# Patient Record
Sex: Male | Born: 1985 | Race: Black or African American | Hispanic: No | Marital: Single | Smoking: Current every day smoker
Health system: Southern US, Community
[De-identification: ages and names within clinical notes are randomized; demographics above are authoritative.]

---
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
## 2007-09-28 ENCOUNTER — Emergency Department (HOSPITAL_COMMUNITY): Admission: EM | Admit: 2007-09-28 | Discharge: 2007-09-28 | Payer: Self-pay | Admitting: Emergency Medicine

## 2009-09-07 IMAGING — US US SCROTUM
1 series · 13 of 25 positions shown · non-contrast
Comparison: None available

CLINICAL DATA: Pelvic pain, back pain the

SCROTAL ULTRASOUND
DOPPLER ULTRASOUND OF THE TESTICLES
TECHNIQUE: Complete ultrasound examination of the testicles,
epididymis, and other scrotal structures was performed.  Color and
spectral Doppler ultrasound were also utilized to evaluate blood
flow to the testicles.

[Series 1: unknown · 0.11mm/px · 13 of 65 slices shown]
[im 1/65]
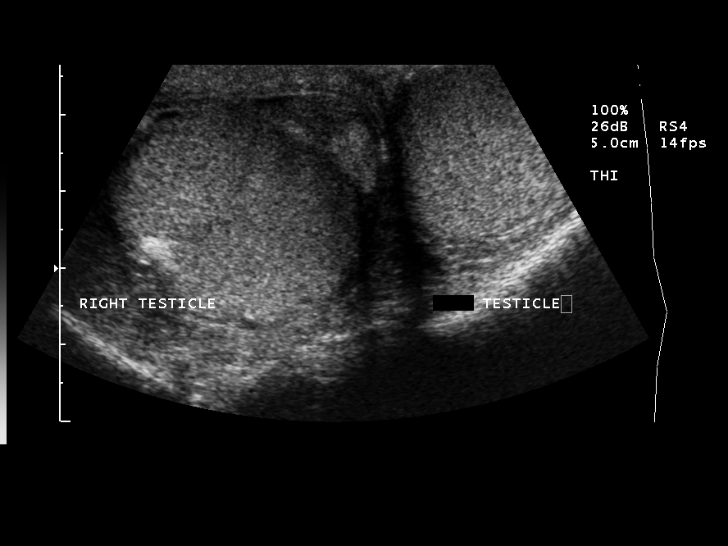
[im 6/65]
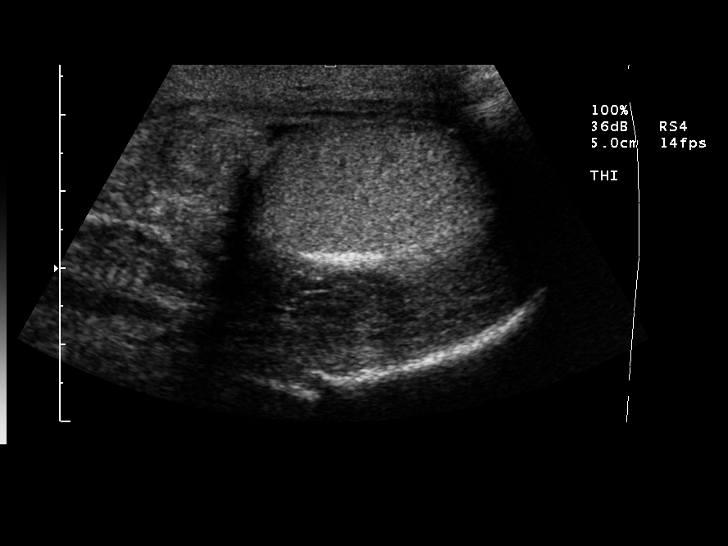
[im 11/65]
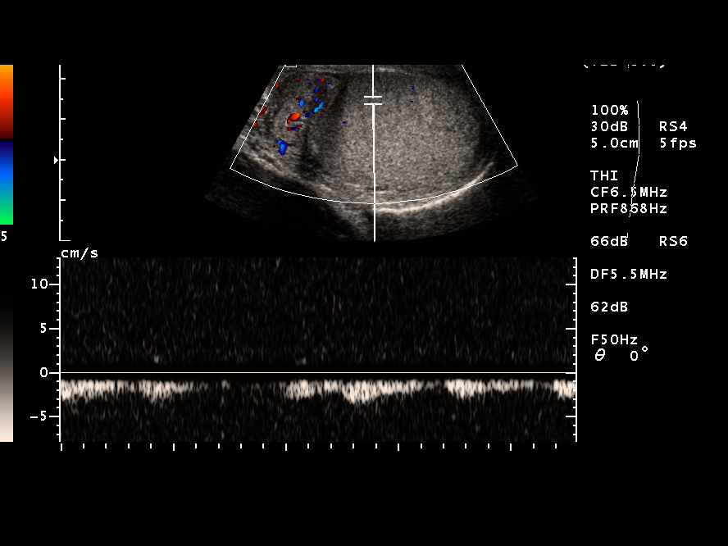
[im 17/65]
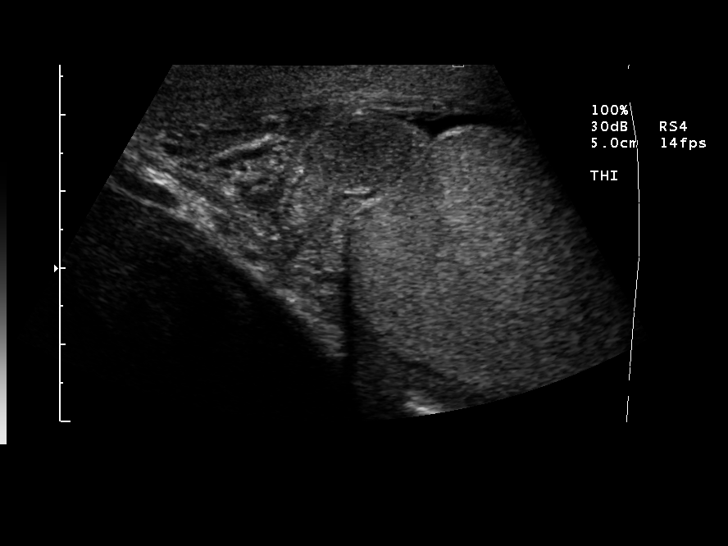
[im 22/65]
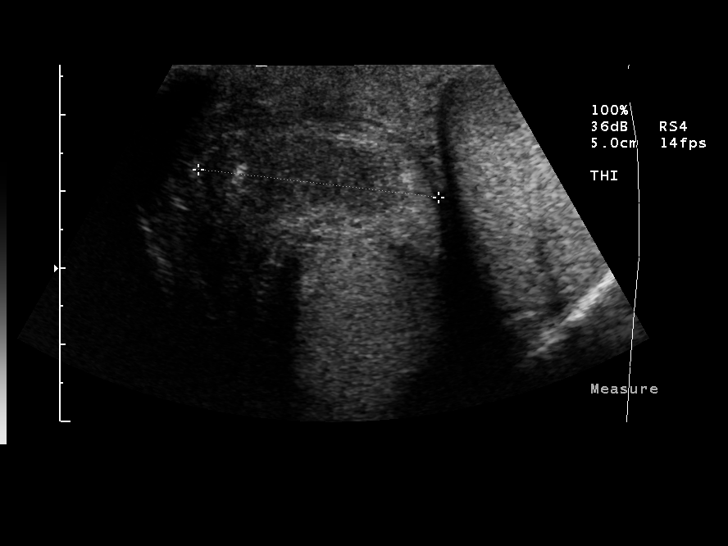
[im 27/65]
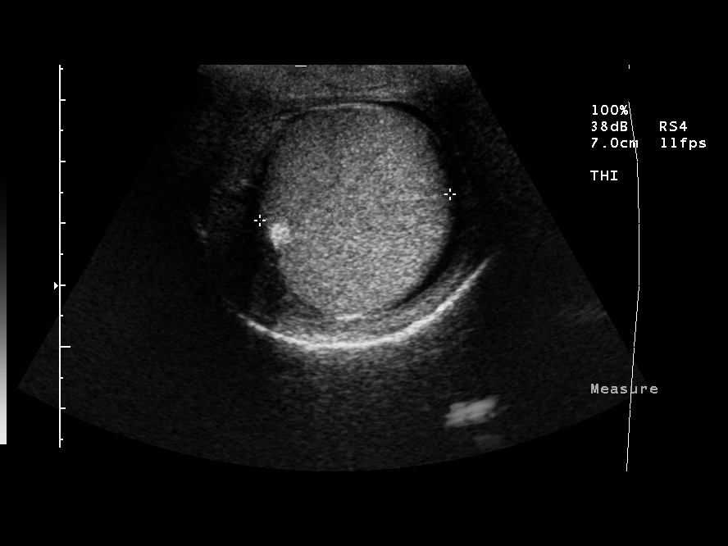
[im 33/65]
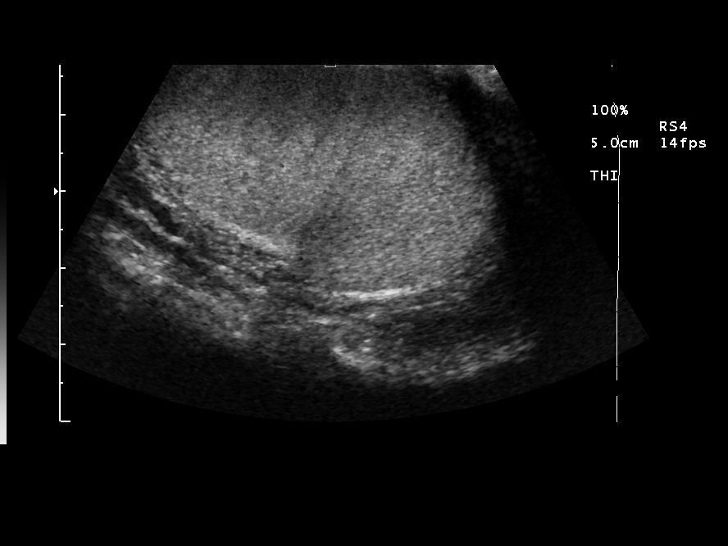
[im 38/65]
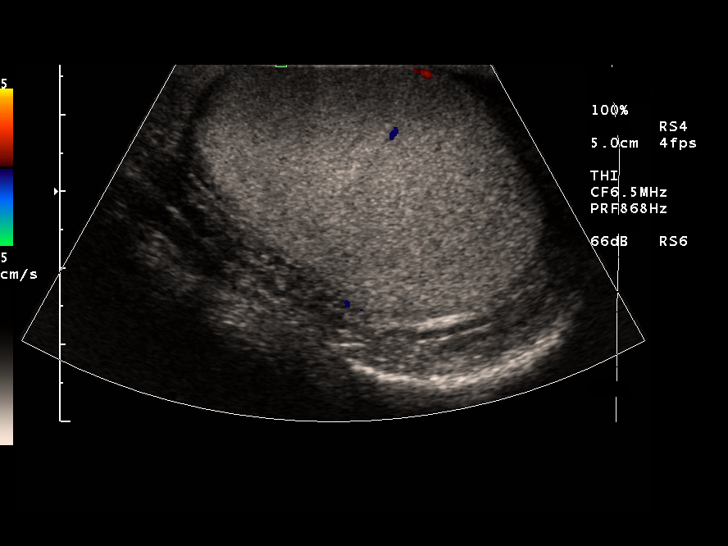
[im 43/65]
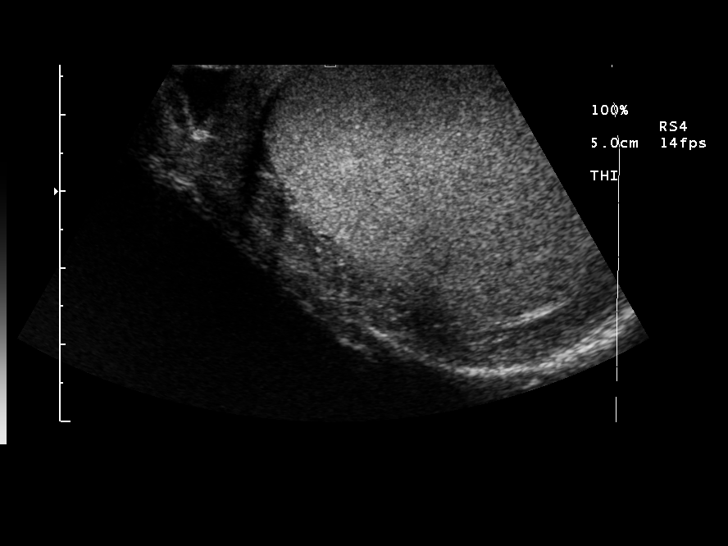
[im 49/65]
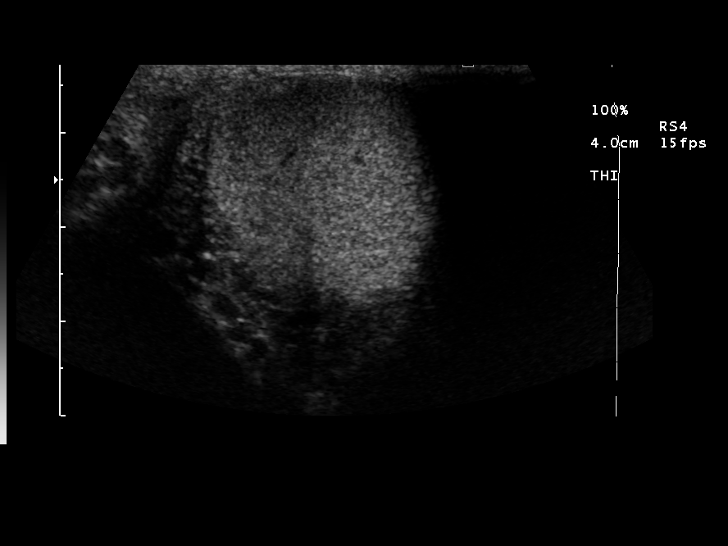
[im 54/65]
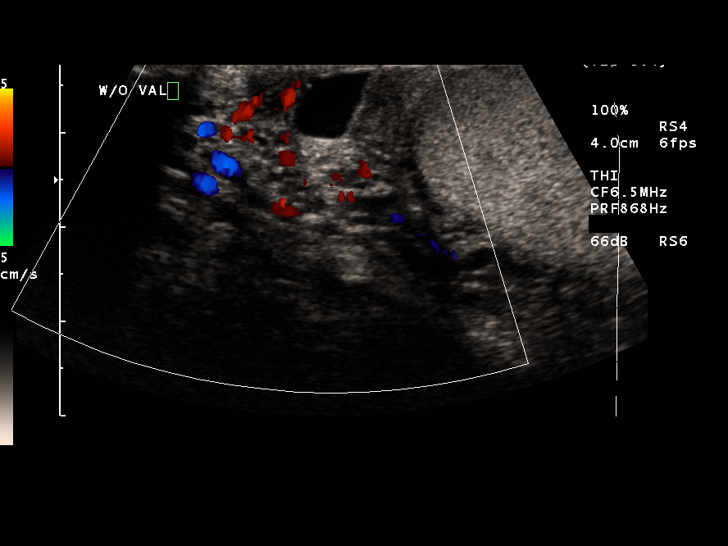
[im 59/65]
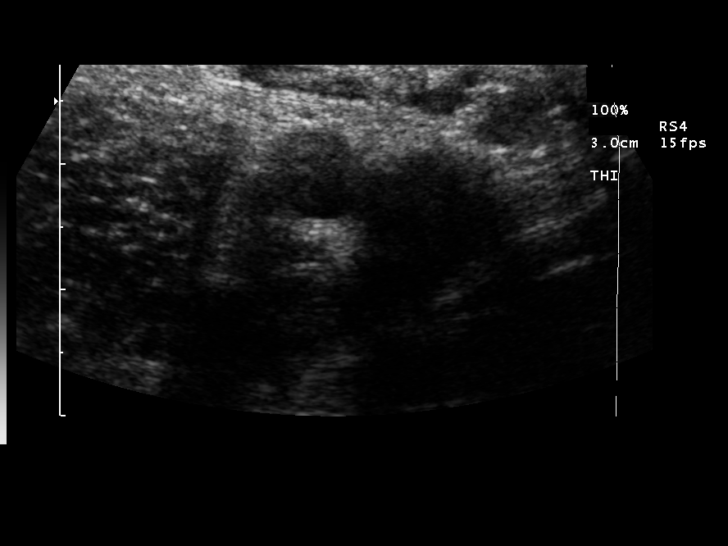
[im 65/65]
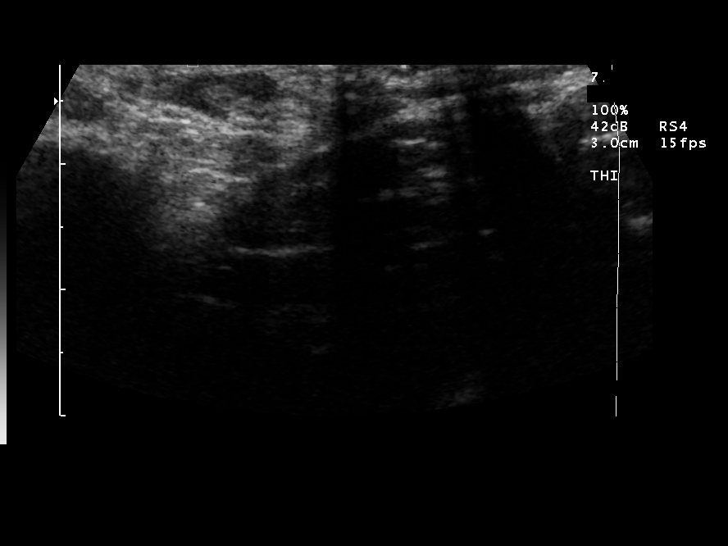

[13 of 25 positions shown; findings below may reference images not displayed]

FINDINGS: Right testicle:  The right testicle is homogeneous in echo texture
measuring 4.0 x 2.8 x 3.1 cm.  There is normal color Doppler flow.
There is normal arterial and venous wave forms.

Left testicle:  Left testicle is  is homogeneous in echo texture
measuring 4.7 x 3.0 x 3.1 cm.  Normal color Doppler flow.  Normal.
Venous wave forms.

The right epididymis is enlarged and hyperemic measuring 1.1 x
x 2.4 cm.  There is a region of focal hypoechogenicity within the
head of the epididymis which measures approximately at 1.6 cm
cm and is concerning for potential epididymal abscess.

Left epididymis is normal.

There is a small right hydrocele.  No evidence of varicoceles.

There is small right inguinal lymph nodes which have normal
morphology.
IMPRESSION: 1..  Enlarged hyperemic right epididymis consist with epididymitis.
2..  Hypoechoic region within the right epididymis is concerning
for potential early epididymal abscess.  Recommend follow-up
ultrasound imaging in 3-6 weeks to reassess this hypoechogenic
region.

## 2010-09-07 NOTE — Op Note (Signed)
NAME:  Brian Mendez, Brian Mendez                         ACCOUNT NO.:  [id number]   MEDICAL RECORD NO.:  [id number]                   PATIENT TYPE:  INP   LOCATION:  6120                                 FACILITY:  [facility]   PHYSICIAN:  Prabhakar D. Pendse, M.D.           DATE OF BIRTH:  11-Jan-1986   DATE OF PROCEDURE:  09/09/2002  DATE OF DISCHARGE:                                 OPERATIVE REPORT   PREOPERATIVE DIAGNOSIS:  Acute appendicitis.   POSTOPERATIVE DIAGNOSIS:  Acute suppurative appendicitis without  perforation.   OPERATION:  Laparoscopy and appendectomy and control of bleeding from the  left lower quadrant incision.   INDICATIONS FOR PROCEDURE:  This 25 year old boy was seen in the emergency  room with about 20 hours history of progressively worse lower abdominal pain  associated with a few episodes of vomiting. There was no history of trauma,  no URI, no diarrhea. Initial white count was 11,000 with shift to the left.  Urinalysis was normal. CT scan of the abdomen showed free fluid in the  pelvic cavity, however, appendix could not be visualized and no other  possible pathology could be seen. In view of these findings, the patient was  admitted for observation and repeat CBC was carried out in the evening which  showed white count of 11,500 with a shift to the left. The patient continued  to have lower abdominal tenderness and exploratory laparoscopy was planned.   FINDINGS:  At the time of exploration, laparoscopic findings were consistent  with appendix to be about 6 inches long folded upon itself and its distal  two-third markedly congested and rigid consistent with acute suppurative  appendicitis. There was some proteinaceous exudate in the area and some free  fluid in the pelvic cavity. No definite gross ulceration could be  identified. The distal ileum appeared unremarkable.   DESCRIPTION OF PROCEDURE:  Under satisfactory general endotracheal  anesthesia, the patient in  supine position, abdomen thoroughly prepped and  draped in the usual manner. An infraumbilical incision was made, carried  through the fascia and the peritoneal cavity entered, Hasson port was  placed, telescope was passed in. Careful exploration revealed findings as  described above. At this time, a 5 mm port was placed through the right mid  quadrant area in order to grasp the base of the appendix and a 12 mm port  was placed in the left lower quadrant area. The appendix was now grasped and  appendiceal mesentery was occluded with hemoclips and cut. Appendix was then  removed by GIA stapler. The appendix was removed with the endobag. After the  appendix was removed, the appendiceal base was inspected again which showed  that there was still about 1 1/2 inch of an appendix left in situ, hence,  further dissection was carried out to serially separate the mesentery, clip  it and cut it. Now again appendectomy was done with stapler and with some  difficulty the appendix was removed. After removal of the appendix and the  appendiceal base, hemostasis was satisfactory. However, there was some  bleeding noted from the left lower quadrant 12 mm port. This could not be  controlled by the usual standard method, hence, the incision was enlarged  and a hemostat was passed through the opening, the peritoneum was grasped  and pulled out and several through and through sutures of 2-0 Vicryl were  placed in order to control the bleeding, the bleeding seemed to be  controlled. At this time, all the wounds were irrigated and closure was  carried out in layers. The skin was  approximated and stapled, appropriate dressings applied. Throughout the  procedure, the patient's vital signs remained stable. The patient tolerated  the procedure well and returned to the recovery room in satisfactory general  condition.                                               Prabhakar D. Levie Heritage, M.D.    PDP/MEDQ  D:   09/09/2002  T:  09/10/2002  Job:  [id number]   cc:   Ivery Quale, P.A.   Cristi Loron, M.D.  [address], [address]  [place]  [place]  [address]  Fax: [phone]

## 2010-09-07 NOTE — Discharge Summary (Signed)
   NAME:  Brian Mendez, Brian Mendez                         ACCOUNT NO.:  [id number]   MEDICAL RECORD NO.:  [id number]                   PATIENT TYPE:  INP   LOCATION:  6120                                 FACILITY:  [facility]   PHYSICIAN:  Pediatrics Resident                 DATE OF BIRTH:  [birth year]   DATE OF ADMISSION:  09/09/2002  DATE OF DISCHARGE:  09/11/2002                                 DISCHARGE SUMMARY   DISCHARGE DIAGNOSES:  Acute suppurative appendicitis without perforation,  status post appendectomy.   HISTORY OF PRESENT ILLNESS:  The patient is a 25 year old African American  male who is status post laparoscopic appendectomy with presentation of fever  and vomiting.  The patient tolerated the procedure well.  The findings are  consistent with suppurative appendicitis without perforation.   HOSPITAL COURSE:  The patient's hospital course was uneventful.  Postoperatively, the patient received triple antibiotics, ampicillin,  gentamicin, and Clindamycin x 48 hours as inpatient.  The patient was  discharged to home on p.o. Augmentin x 5 days to finish antibiotic course.  To control the pain, the patient initially needed Morphine; however, he has  been weaned from Morphine and is just on p.o. Tylenol for the last 12 hours.   The patient is ambulatory and is currently on a clear liquid diet.  He has  been well hydrated since his IV fluids were stopped.  The patient's wound  dressing site has been clean and dry without erythema.  The patient is to be  discharged to home today and he is eager to attend his mother's wedding.  He  is directed to continue on a clear liquid diet today, a softer diet  tomorrow, and advance as tolerated after tomorrow.   CONDITION ON DISCHARGE:  Improved and is stable.   DISCHARGE MEDICATIONS:  1. Augmentin 500 mg p.o. t.i.d. x 5 more days.  2. Tylenol 650 mg p.o. q.6h. p.r.n. pain.   FOLLOW UP:  The patient will follow up with Dr. Donnella Bi D. Pendse in  10  days and his office number was given to make an appointment.                                               Pediatrics Resident    PR/MEDQ  D:  09/11/2002  T:  09/11/2002  Job:  [id number]

## 2011-01-17 LAB — URINALYSIS, ROUTINE W REFLEX MICROSCOPIC
Bilirubin Urine: NEGATIVE
Glucose, UA: NEGATIVE
Nitrite: NEGATIVE
Specific Gravity, Urine: 1.02
pH: 7.5

## 2011-01-17 LAB — URINE MICROSCOPIC-ADD ON

## 2011-01-17 LAB — RPR: RPR Ser Ql: NONREACTIVE

## 2013-10-17 ENCOUNTER — Emergency Department (HOSPITAL_COMMUNITY)
Admission: EM | Admit: 2013-10-17 | Discharge: 2013-10-17 | Disposition: A | Discharge status: Home / Self Care | Payer: Self-pay | Attending: Emergency Medicine | Admitting: Emergency Medicine

## 2013-10-17 ENCOUNTER — Encounter (HOSPITAL_COMMUNITY): Payer: Self-pay | Admitting: Emergency Medicine

## 2013-10-17 DIAGNOSIS — K029 Dental caries, unspecified: Secondary | ICD-10-CM | POA: Insufficient documentation

## 2013-10-17 DIAGNOSIS — M2763 Post-osseointegration mechanical failure of dental implant: Secondary | ICD-10-CM | POA: Insufficient documentation

## 2013-10-17 DIAGNOSIS — T85848A Pain due to other internal prosthetic devices, implants and grafts, initial encounter: Secondary | ICD-10-CM

## 2013-10-17 DIAGNOSIS — F17200 Nicotine dependence, unspecified, uncomplicated: Secondary | ICD-10-CM | POA: Insufficient documentation

## 2013-10-17 MED ORDER — AMOXICILLIN 500 MG PO CAPS: 500.0000 mg | ORAL_CAPSULE | Freq: Three times a day (TID) | ORAL | Status: DC

## 2013-10-17 MED ORDER — HYDROCODONE-ACETAMINOPHEN 7.5-325 MG/15ML PO SOLN: 15.0000 mL | Freq: Four times a day (QID) | ORAL | Status: DC | PRN

## 2013-10-17 NOTE — Discharge Instructions (Signed)
Take vicodin for breakthrough pain, do not drink alcohol, drive, care for children or do other critical tasks while taking vicodin.  Return to the emergency room for fever, change in vision, redness to the face that rapidly spreads towards the eye, nausea or vomiting, difficulty swallowing or shortness of breath.   Apply warm compresses to jaw throughout the day.   Take your antibiotics as directed and to the end of the course.   Followup with a dentist is very important for ongoing evaluation and management of recurrent dental pain. Return to emergency department for emergent changing or worsening symptoms."  Low-cost dental clinic: Brian Mendez**Brian  Mendez  at [phone]**  **Brian Mendez at [phone] [address]**    You may also call [phone]  Dental Assistance If the dentist on-call cannot see you, please use the resources below:   Patients with Medicaid: [facility] [phone], [phone] [address], [phone]  If unable to pay, or uninsured, contact [facility] [phone]) or [facility] [phone] in [place], [phone] in [facility]) to become qualified for the adult dental clinic  Other Low-Cost Community Dental Services: [facility]- [address], [place], [place], [address]    [phone], Ext. [phone]    2nd and 4th Thursday of the month at 6:30am    10 clients each day by appointment, can sometimes see walk-in     patients if someone does not show for an appointment [facility]- [address], [place], [place], [address]    [phone] [facility]- [address], [place], [place], [address]    [phone]  [facility]- [phone] [facility]- [phone] [facility]- [phone]

## 2013-10-17 NOTE — ED Provider Notes (Signed)
CSN: [id number]     Arrival date & time 10/17/13  1313 History  This chart was scribed for non-physician practitioner, Wynetta EmeryNicole Pisciotta, PA-C,working with Vanetta MuldersScott Zackowski, MD, by Karle PlumberJennifer Tensley, ED Scribe.  This patient was seen in room WTR8/WTR8 and the patient's care was started at 3:12 PM.  Chief Complaint  Patient presents with  . Dental Pain   The history is provided by the patient. No language interpreter was used.   HPI Comments:  Brian Mendez is a 28 y.o. male who presents to the Emergency Department complaining of severe lower and upper right-sided dental pain secondary to breaking a tooth six months ago. Pt reports the pain was tolerable until "a few days ago". Pt reports the pain is 10/10. He states he has not taken anything for pain. He denies fever or chills.  History reviewed. No pertinent past medical history. Past Surgical History  Procedure Laterality Date  . Appendectomy  2004   No family history on file. History  Substance Use Topics  . Smoking status: Current Every Day Smoker  . Smokeless tobacco: Current User  . Alcohol Use: Yes     Comment: socially - once/week    Review of Systems A complete 10 system review of systems was obtained and all systems are negative except as noted in the HPI and PMH.   Allergies  Review of patient's allergies indicates no known allergies.  Home Medications   Prior to Admission medications   Medication Sig Start Date End Date Taking? Authorizing Provider  amoxicillin (AMOXIL) 500 MG capsule Take 1 capsule (500 mg total) by mouth 3 (three) times daily. 10/17/13   Nicole Pisciotta, PA-C  HYDROcodone-acetaminophen (HYCET) 7.5-325 mg/15 ml solution Take 15 mLs by mouth every 6 (six) hours as needed. 10/17/13   Joni ReiningNicole Pisciotta, PA-C   Triage Vitals: BP 112/69  Pulse 67  Temp(Src) 97.8 F (36.6 C) (Oral)  Resp 17  Ht 5\' 7"  (1.702 m)  Wt 140 lb (63.504 kg)  BMI 21.92 kg/m2  SpO2 97% Physical Exam  Nursing note and vitals  reviewed. Constitutional: He is oriented to person, place, and time. He appears well-developed and well-nourished.  HENT:  Head: Normocephalic and atraumatic.  Generally poor dentition, no gingival swelling, erythema or tenderness to palpation. Patient is handling their secretions. There is no tenderness to palpation or firmness underneath tongue bilaterally. No trismus.    Eyes: EOM are normal. Pupils are equal, round, and reactive to light.  Neck: Normal range of motion.  Cardiovascular: Normal rate.   Pulmonary/Chest: Effort normal.  Musculoskeletal: Normal range of motion.  Neurological: He is alert and oriented to person, place, and time.  Skin: Skin is warm and dry.  Psychiatric: He has a normal mood and affect. His behavior is normal.    ED Course  Procedures (including critical care time) DIAGNOSTIC STUDIES: Oxygen Saturation is 97% on RA, normal by my interpretation.   COORDINATION OF CARE: 3:14 PM- Will prescribe pain medication and antibiotic. Pt verbalizes understanding and agrees to plan.  Medications - No data to display  Labs Review Labs Reviewed - No data to display  Imaging Review No results found.   EKG Interpretation None      MDM   Final diagnoses:  Dental implant pain, initial encounter    Filed Vitals:   10/17/13 1322 10/17/13 1539  BP: 112/69 129/93  Pulse: 67 58  Temp: 97.8 F (36.6 C) 97.7 F (36.5 C)  TempSrc: Oral Oral  Resp: 17 18  Height: 5\' 7"  (1.702 m)   Weight: 140 lb (63.504 kg)   SpO2: 97% 99%    Medications - No data to display  Brian MinsRashawn D Rybarczyk is a 28 y.o. male presenting with dental pain associated with dental cary but no signs or symptoms of dental abscess. Patient afebrile, non toxic appearing and swallowing secretions well. I gave patient referral to dentist and stressed the importance of dental follow up for ultimate management of dental pain. Patient voices understanding and is agreeable to plan.  Evaluation does  not show pathology that would require ongoing emergent intervention or inpatient treatment. Pt is hemodynamically stable and mentating appropriately. Discussed findings and plan with patient/guardian, who agrees with care plan. All questions answered. Return precautions discussed and outpatient follow up given.   Discharge Medication List as of 10/17/2013  3:19 PM    START taking these medications   Details  amoxicillin (AMOXIL) 500 MG capsule Take 1 capsule (500 mg total) by mouth 3 (three) times daily., Starting 10/17/2013, Until Discontinued, Print    HYDROcodone-acetaminophen (HYCET) 7.5-325 mg/15 ml solution Take 15 mLs by mouth every 6 (six) hours as needed., Starting 10/17/2013, Until Discontinued, Print           I personally performed the services described in this documentation, which was scribed in my presence. The recorded information has been reviewed and is accurate.    Wynetta Emeryicole Pisciotta, PA-C 10/17/13 1543

## 2013-10-17 NOTE — ED Notes (Signed)
Pt c/o dental pain on right side of mouth.  Pt has broken upper right molar and broken tooth on lower right side as well.  Pt denies N/V and fever.  Pt saw a dentist about this @ 6 months ago and was told the teeth would need to be pulled.  Pt did not follow-up for treatment.

## 2013-10-19 NOTE — ED Provider Notes (Signed)
Medical screening examination/treatment/procedure(s) were performed by non-physician practitioner and as supervising physician I was immediately available for consultation/collaboration.   EKG Interpretation None        Scott Zackowski, MD 10/19/13 0820 

## 2013-11-14 ENCOUNTER — Ambulatory Visit (INDEPENDENT_AMBULATORY_CARE_PROVIDER_SITE_OTHER): Payer: Self-pay | Admitting: Emergency Medicine

## 2013-11-14 VITALS — BP 114/70 | HR 60 | Temp 97.7°F | Resp 20 | Ht 66.75 in | Wt 120.6 lb

## 2013-11-14 DIAGNOSIS — H10022 Other mucopurulent conjunctivitis, left eye: Secondary | ICD-10-CM

## 2013-11-14 DIAGNOSIS — H10029 Other mucopurulent conjunctivitis, unspecified eye: Secondary | ICD-10-CM

## 2013-11-14 MED ORDER — OFLOXACIN 0.3 % OP SOLN: 1.0000 [drp] | OPHTHALMIC | Status: DC

## 2013-11-14 NOTE — Progress Notes (Signed)
   Subjective:    Patient ID: Brian Mendez, male    DOB: [birth year], 28 y.o.   MRN: [id number] This chart was scribed for Brian GobbleSteven A Gredmarie Delange, MD by Brian Mendez, ED Scribe. [facility] The patient's care was started t 2:15 PM.  HPI HPI Comments: Brian MinsRashawn D Mendez is a 28 y.o. male who [facility] complaining of conjunctivitis onset 2 days. Pt states that he woke up this morning with his left eye glued shut with associated wateriness and crusting that will not go away. Pt states he has a 725 month old and goes on to state that he was a kids birthday party a few days ago but denies being around anyone with pink eye to his knowledge. He further denies any itching to his eye as well.    Review of Systems  Eyes: Positive for pain, redness and visual disturbance. Negative for itching.       Crusting of left eye.        Objective:   Physical Exam CONSTITUTIONAL: Well developed/well nourished HEAD: Normocephalic/atraumatic EYES: EOMI/PERRL, eyelid everted and swobbed, significant injection of the conjunctiva.3 drops of Proparacaine and Fluorescein test negative.   ENMT: Mucous membranes moist NECK: supple no meningeal signs SPINE:entire spine nontender CV: S1/S2 noted, no murmurs/rubs/gallops noted LUNGS: Lungs are clear to auscultation bilaterally, no apparent distress ABDOMEN: soft, nontender, no rebound or guarding GU:no cva tenderness NEURO: Pt is awake/alert, moves all extremitiesx4 EXTREMITIES: pulses normal, full ROM SKIN: warm, color normal PSYCH: no abnormalities of mood noted        Assessment & Plan:  2:21 PM- Treatment plan was discussed with patient who verbalizes understanding and agrees. Patient to be treated with Ocuflox eyedrops.  I personally performed the services described in this documentation, which was scribed in my presence. The recorded information has been reviewed and is accurate.

## 2013-11-14 NOTE — Patient Instructions (Signed)

## 2014-04-09 ENCOUNTER — Encounter (HOSPITAL_COMMUNITY): Payer: Self-pay | Admitting: Emergency Medicine

## 2014-04-09 ENCOUNTER — Emergency Department (HOSPITAL_COMMUNITY)
Admission: EM | Admit: 2014-04-09 | Discharge: 2014-04-09 | Discharge status: Against Medical Advice | Payer: Self-pay | Attending: Emergency Medicine | Admitting: Emergency Medicine

## 2014-04-09 ENCOUNTER — Emergency Department (HOSPITAL_COMMUNITY)
Admission: EM | Admit: 2014-04-09 | Discharge: 2014-04-09 | Disposition: A | Discharge status: Home / Self Care | Payer: No Typology Code available for payment source | Attending: Emergency Medicine | Admitting: Emergency Medicine

## 2014-04-09 ENCOUNTER — Encounter (HOSPITAL_COMMUNITY): Payer: Self-pay | Admitting: *Deleted

## 2014-04-09 DIAGNOSIS — S24109A Unspecified injury at unspecified level of thoracic spinal cord, initial encounter: Secondary | ICD-10-CM | POA: Insufficient documentation

## 2014-04-09 DIAGNOSIS — M542 Cervicalgia: Secondary | ICD-10-CM

## 2014-04-09 DIAGNOSIS — Y9389 Activity, other specified: Secondary | ICD-10-CM | POA: Insufficient documentation

## 2014-04-09 DIAGNOSIS — S4992XA Unspecified injury of left shoulder and upper arm, initial encounter: Secondary | ICD-10-CM | POA: Insufficient documentation

## 2014-04-09 DIAGNOSIS — Y998 Other external cause status: Secondary | ICD-10-CM | POA: Insufficient documentation

## 2014-04-09 DIAGNOSIS — Y92410 Unspecified street and highway as the place of occurrence of the external cause: Secondary | ICD-10-CM | POA: Insufficient documentation

## 2014-04-09 DIAGNOSIS — S3992XA Unspecified injury of lower back, initial encounter: Secondary | ICD-10-CM | POA: Insufficient documentation

## 2014-04-09 DIAGNOSIS — M545 Low back pain, unspecified: Secondary | ICD-10-CM

## 2014-04-09 DIAGNOSIS — Z720 Tobacco use: Secondary | ICD-10-CM | POA: Insufficient documentation

## 2014-04-09 DIAGNOSIS — S199XXA Unspecified injury of neck, initial encounter: Secondary | ICD-10-CM | POA: Insufficient documentation

## 2014-04-09 MED ORDER — CYCLOBENZAPRINE HCL 10 MG PO TABS: 10.0000 mg | ORAL_TABLET | Freq: Three times a day (TID) | ORAL | Status: DC | PRN

## 2014-04-09 MED ORDER — IBUPROFEN 800 MG PO TABS: 800.0000 mg | ORAL_TABLET | Freq: Three times a day (TID) | ORAL | Status: DC | PRN
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
## 2014-04-09 NOTE — Discharge Instructions (Signed)
Read the information below.  Use the prescribed medication as directed.  Please discuss all new medications with your pharmacist.  You may return to the Emergency Department at any time for worsening condition or any new symptoms that concern you.    If you develop fevers, loss of control of bowel or bladder, weakness or numbness in your legs, or are unable to walk, return to the ER for a recheck.  °

## 2014-04-09 NOTE — ED Notes (Signed)
Pt was called x 3 without answer; pt has eloped from ER in the past
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
## 2014-04-28 ENCOUNTER — Emergency Department (HOSPITAL_COMMUNITY)
Admission: EM | Admit: 2014-04-28 | Discharge: 2014-04-28 | Disposition: A | Discharge status: Home / Self Care | Payer: No Typology Code available for payment source | Attending: Emergency Medicine | Admitting: Emergency Medicine

## 2014-04-28 ENCOUNTER — Encounter (HOSPITAL_COMMUNITY): Payer: Self-pay | Admitting: Emergency Medicine

## 2014-04-28 DIAGNOSIS — H2000 Unspecified acute and subacute iridocyclitis: Secondary | ICD-10-CM

## 2014-04-28 DIAGNOSIS — Z792 Long term (current) use of antibiotics: Secondary | ICD-10-CM | POA: Insufficient documentation

## 2014-04-28 DIAGNOSIS — Z720 Tobacco use: Secondary | ICD-10-CM | POA: Insufficient documentation

## 2014-04-28 MED ORDER — FLUORESCEIN SODIUM 1 MG OP STRP
1.0000 | ORAL_STRIP | Freq: Once | OPHTHALMIC | Status: AC
  Administered 2014-04-28: 1 via OPHTHALMIC
  Filled 2014-04-28: qty 1

## 2014-04-28 MED ORDER — TETRACAINE HCL 0.5 % OP SOLN
2.0000 [drp] | Freq: Once | OPHTHALMIC | Status: AC
  Administered 2014-04-28: 2 [drp] via OPHTHALMIC
  Filled 2014-04-28: qty 2

## 2014-04-28 MED ORDER — PREDNISOLONE ACETATE 1 % OP SUSP
1.0000 [drp] | OPHTHALMIC | Status: DC
Start: 2014-04-28 — End: 2023-12-22

## 2014-04-28 MED ORDER — PREDNISOLONE ACETATE 1 % OP SUSP
1.0000 [drp] | Freq: Once | OPHTHALMIC | Status: AC
  Administered 2014-04-28: 1 [drp] via OPHTHALMIC
  Filled 2014-04-28: qty 1

## 2014-04-28 NOTE — ED Notes (Signed)
Pt reports 3 day hx of l/eye pain and redness. Pt was seen and treated at "urgent care" 3 days ago.
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
## 2014-04-28 NOTE — Discharge Instructions (Signed)
Please call the eye doctor, Dr. Alfredia Fergusonleveland and set up an appointment to be seen this afternoon. He may use the drops you were given in the ED every 2 hours to help with her discomfort. Do not hesitate to return to the emergency room for any new, worsening or concerning symptoms.   Iritis Iritis is when the colored part of the eye (iris) is red and painful (inflamed). Light might seem to hurt your eyes (light sensitivity). You may have blurred vision or start to tear up. It is important to treat this problem early.  HOME CARE  Use eyedrops or pills (corticosteroid medicine) as told by your doctor.  Only take medicine as told by your doctor. GET HELP RIGHT AWAY IF:   You have redness in one or both eyes.  Light seems to hurt your eyes.  You have pain in one or both eyes. MAKE SURE YOU:   Understand these instructions.  Will watch your condition.  Will get help right away if you are not doing well or get worse. Document Released: 07/03/2009 Document Revised: 07/01/2011 Document Reviewed: 07/03/2009 [facility] Patient Information 2015 [employer], [place]. This information is not intended to replace advice given to you by your health care provider. Make sure you discuss any questions you have with your health care provider.

## 2014-07-27 ENCOUNTER — Encounter (HOSPITAL_COMMUNITY): Payer: Self-pay | Admitting: Emergency Medicine

## 2014-07-27 ENCOUNTER — Emergency Department (HOSPITAL_COMMUNITY)
Admission: EM | Admit: 2014-07-27 | Discharge: 2014-07-27 | Disposition: A | Discharge status: Home / Self Care | Payer: Self-pay | Attending: Emergency Medicine | Admitting: Emergency Medicine

## 2014-07-27 ENCOUNTER — Emergency Department (HOSPITAL_COMMUNITY): Payer: Self-pay

## 2014-07-27 DIAGNOSIS — R05 Cough: Secondary | ICD-10-CM

## 2014-07-27 DIAGNOSIS — Z720 Tobacco use: Secondary | ICD-10-CM | POA: Insufficient documentation

## 2014-07-27 DIAGNOSIS — R059 Cough, unspecified: Secondary | ICD-10-CM

## 2014-07-27 DIAGNOSIS — H9312 Tinnitus, left ear: Secondary | ICD-10-CM | POA: Insufficient documentation

## 2014-07-27 DIAGNOSIS — R053 Chronic cough: Secondary | ICD-10-CM

## 2014-07-27 DIAGNOSIS — J40 Bronchitis, not specified as acute or chronic: Secondary | ICD-10-CM | POA: Insufficient documentation

## 2014-07-27 DIAGNOSIS — Z792 Long term (current) use of antibiotics: Secondary | ICD-10-CM | POA: Insufficient documentation

## 2014-07-27 MED ORDER — BENZONATATE 200 MG PO CAPS: 200.0000 mg | ORAL_CAPSULE | Freq: Three times a day (TID) | ORAL | Status: DC | PRN

## 2014-07-27 MED ORDER — BENZONATATE 100 MG PO CAPS
200.0000 mg | ORAL_CAPSULE | Freq: Three times a day (TID) | ORAL | Status: DC | PRN
  Administered 2014-07-27: 200 mg via ORAL
  Filled 2014-07-27: qty 2

## 2014-07-27 MED ORDER — ALBUTEROL SULFATE HFA 108 (90 BASE) MCG/ACT IN AERS
1.0000 | INHALATION_SPRAY | RESPIRATORY_TRACT | Status: DC | PRN
  Administered 2014-07-27: 2 via RESPIRATORY_TRACT
  Filled 2014-07-27: qty 6.7

## 2014-07-27 MED ORDER — GUAIFENESIN-CODEINE 100-10 MG/5ML PO SOLN: 5.0000 mL | ORAL | Status: DC | PRN

## 2014-07-27 MED ORDER — GUAIFENESIN-CODEINE 100-10 MG/5ML PO SOLN
5.0000 mL | ORAL | Status: DC | PRN
  Filled 2014-07-27: qty 5

## 2014-07-27 NOTE — ED Notes (Addendum)
Pt. reports persistent productive cough for 3 weeks with nasal congestion / runny nose unrelieved by OTC medications , denies fever or chills.

## 2014-07-27 NOTE — Discharge Instructions (Signed)
Cough, Adult ° A cough is a reflex that helps clear your throat and airways. It can help heal the body or may be a reaction to an irritated airway. A cough may only last 2 or 3 weeks (acute) or may last more than 8 weeks (chronic).  °CAUSES °Acute cough: °· Viral or bacterial infections. °Chronic cough: °· Infections. °· Allergies. °· Asthma. °· Post-nasal drip. °· Smoking. °· Heartburn or acid reflux. °· Some medicines. °· Chronic lung problems (COPD). °· Cancer. °SYMPTOMS  °· Cough. °· Fever. °· Chest pain. °· Increased breathing rate. °· High-pitched whistling sound when breathing (wheezing). °· Colored mucus that you cough up (sputum). °TREATMENT  °· A bacterial cough may be treated with antibiotic medicine. °· A viral cough must run its course and will not respond to antibiotics. °· Your caregiver may recommend other treatments if you have a chronic cough. °HOME CARE INSTRUCTIONS  °· Only take over-the-counter or prescription medicines for pain, discomfort, or fever as directed by your caregiver. Use cough suppressants only as directed by your caregiver. °· Use a cold steam vaporizer or humidifier in your bedroom or home to help loosen secretions. °· Sleep in a semi-upright position if your cough is worse at night. °· Rest as needed. °· Stop smoking if you smoke. °SEEK IMMEDIATE MEDICAL CARE IF:  °· You have pus in your sputum. °· Your cough starts to worsen. °· You cannot control your cough with suppressants and are losing sleep. °· You begin coughing up blood. °· You have difficulty breathing. °· You develop pain which is getting worse or is uncontrolled with medicine. °· You have a fever. °MAKE SURE YOU:  °· Understand these instructions. °· Will watch your condition. °· Will get help right away if you are not doing well or get worse. °Document Released: 10/05/2010 Document Revised: 07/01/2011 Document Reviewed: 10/05/2010 °ExitCare® Patient Information ©2015 [employer], LLC. This information is not intended  to replace advice given to you by your health care provider. Make sure you discuss any questions you have with your health care provider. ° °Cool Mist Vaporizers °Vaporizers may help relieve the symptoms of a cough and cold. They add moisture to the air, which helps mucus to become thinner and less sticky. This makes it easier to breathe and cough up secretions. Cool mist vaporizers do not cause serious burns like hot mist vaporizers, which may also be called steamers or humidifiers. Vaporizers have not been proven to help with colds. You should not use a vaporizer if you are allergic to mold. °HOME CARE INSTRUCTIONS °· Follow the package instructions for the vaporizer. °· Do not use anything other than distilled water in the vaporizer. °· Do not run the vaporizer all of the time. This can cause mold or bacteria to grow in the vaporizer. °· Clean the vaporizer after each time it is used. °· Clean and dry the vaporizer well before storing it. °· Stop using the vaporizer if worsening respiratory symptoms develop. °Document Released: 01/04/2004 Document Revised: 04/13/2013 Document Reviewed: 08/26/2012 °ExitCare® Patient Information ©2015 Exit[employer], LLC. This information is not intended to replace advice given to you by your health care provider. Make sure you discuss any questions you have with your health care provider. ° °

## 2014-07-27 NOTE — ED Provider Notes (Signed)
CSN: [id number]     Arrival date & time 07/27/14  0032 History  This chart was scribed for Marisa Severin, MD by Annye Asa, ED Scribe. This patient was seen in room A10C/A10C and the patient's care was started at 2:28 AM.    Chief Complaint  Patient presents with  . Cough   Patient is a 29 y.o. male presenting with cough. The history is provided by the patient. No language interpreter was used.  Cough Associated symptoms: ear pain and rhinorrhea   Associated symptoms: no chills and no fever      HPI Comments: Brian Mendez is an otherwise healthy 29 y.o. male who presents to the Emergency Department complaining of 3 weeks of productive cough (green, yellow sputum with small amount of blood). He also reports sore throat, rhinorrhea, and 1 day of left-sided ear pain. He states he has taken OTC medications without significant relief; his place of employment insisted that he come to be checked due to his ongoing cough. He does note sick contacts at work. He denies fevers, prior allergies, personal history of asthma.   Patient is a .3ppd smoker.   History reviewed. No pertinent past medical history. Past Surgical History  Procedure Laterality Date  . Appendectomy  2004   No family history on file. History  Substance Use Topics  . Smoking status: Current Every Day Smoker  . Smokeless tobacco: Current User  . Alcohol Use: Yes     Comment: socially - once/week    Review of Systems  Constitutional: Negative for fever and chills.  HENT: Positive for ear pain, rhinorrhea and tinnitus.   Respiratory: Positive for cough.   All other systems reviewed and are negative.   Allergies  Review of patient's allergies indicates no known allergies.  Home Medications   Prior to Admission medications   Medication Sig Start Date End Date Taking? Authorizing Provider  [facility] OP Place 1 drop into the left eye daily.    Historical Provider, MD  prednisoLONE acetate (PRED FORTE)  1 % ophthalmic suspension Place 1 drop into the right eye every 2 (two) hours while awake. 04/28/14   Nicole Pisciotta, PA-C   BP 132/75 mmHg  Pulse 92  Temp(Src) 98.4 F (36.9 C) (Oral)  Resp 16  SpO2 94% Physical Exam  Constitutional: He is oriented to person, place, and time. He appears well-developed and well-nourished. No distress.  HENT:  Head: Normocephalic and atraumatic.  Right Ear: Tympanic membrane normal.  Left Ear: Tympanic membrane normal.  Nose: Nose normal.  Mouth/Throat: Oropharynx is clear and moist. No oropharyngeal exudate.  Moist mucous membranes  Eyes: EOM are normal. Pupils are equal, round, and reactive to light.  Neck: Normal range of motion. Neck supple. No JVD present.  Cardiovascular: Normal rate, regular rhythm and normal heart sounds.  Exam reveals no gallop and no friction rub.   No murmur heard. Pulmonary/Chest: Effort normal. No respiratory distress. He has wheezes (Expiratory wheeze). He has no rales.  Cough present  Abdominal: Soft. Bowel sounds are normal. He exhibits no mass. There is no tenderness. There is no rebound and no guarding.  Musculoskeletal: Normal range of motion. He exhibits no edema.  Moves all extremities normally.   Lymphadenopathy:    He has no cervical adenopathy.  Neurological: He is alert and oriented to person, place, and time. He displays normal reflexes.  Skin: Skin is warm and dry. No rash noted.  Psychiatric: He has a normal mood and affect. His behavior is  normal.  Nursing note and vitals reviewed.   ED Course  Procedures   DIAGNOSTIC STUDIES: Oxygen Saturation is 94% on RA, low by my interpretation.    COORDINATION OF CARE: 2:31 AM Discussed treatment plan with pt at bedside and pt agreed to plan.   Labs Review Labs Reviewed - No data to display  Imaging Review Dg Chest 2 View  07/27/2014   CLINICAL DATA:  Cough for 3 weeks  EXAM: CHEST  2 VIEW  COMPARISON:  None.  FINDINGS: Normal heart size and  mediastinal contours. No acute infiltrate or edema. No effusion or pneumothorax. No acute osseous findings.  IMPRESSION: Negative chest.   Electronically Signed   By: Marnee SpringJonathon  Watts M.D.   On: 07/27/2014 01:21     EKG Interpretation None      MDM   Final diagnoses:  Cough  Bronchitis    I personally performed the services described in this documentation, which was scribed in my presence. The recorded information has been reviewed and is accurate.  [age]-year-old male with persistent cough.  Wheezing noted on exam.  Suspect bronchitis.  Will treat symptomatically.     Marisa Severinlga Tarrin Lebow, MD 07/27/14 [phone]

## 2015-04-14 ENCOUNTER — Emergency Department (HOSPITAL_COMMUNITY)
Admission: EM | Admit: 2015-04-14 | Discharge: 2015-04-14 | Disposition: A | Discharge status: Home / Self Care | Payer: Self-pay | Attending: Emergency Medicine | Admitting: Emergency Medicine

## 2015-04-14 ENCOUNTER — Encounter (HOSPITAL_COMMUNITY): Payer: Self-pay | Admitting: *Deleted

## 2015-04-14 DIAGNOSIS — K029 Dental caries, unspecified: Secondary | ICD-10-CM | POA: Insufficient documentation

## 2015-04-14 DIAGNOSIS — F17210 Nicotine dependence, cigarettes, uncomplicated: Secondary | ICD-10-CM | POA: Insufficient documentation

## 2015-04-14 DIAGNOSIS — K002 Abnormalities of size and form of teeth: Secondary | ICD-10-CM | POA: Insufficient documentation

## 2015-04-14 DIAGNOSIS — K0889 Other specified disorders of teeth and supporting structures: Secondary | ICD-10-CM | POA: Insufficient documentation

## 2015-04-14 MED ORDER — TRAMADOL HCL 50 MG PO TABS: 50.0000 mg | ORAL_TABLET | Freq: Two times a day (BID) | ORAL | Status: DC | PRN

## 2015-04-14 MED ORDER — IBUPROFEN 200 MG PO TABS
600.0000 mg | ORAL_TABLET | Freq: Once | ORAL | Status: AC
  Administered 2015-04-14: 600 mg via ORAL
  Filled 2015-04-14: qty 3

## 2015-04-14 MED ORDER — HYDROCODONE-ACETAMINOPHEN 5-325 MG PO TABS
2.0000 | ORAL_TABLET | Freq: Once | ORAL | Status: AC
  Administered 2015-04-14: 2 via ORAL
  Filled 2015-04-14: qty 2

## 2015-04-14 NOTE — Discharge Instructions (Signed)
Dental Pain Mr. Willa RoughHicks, there are no signs of infection in your teeth.  You need to have your teeth pulled by a dentist.  Continue to take ibuprofen at home as needed.  If your pain becomes severe, take tramadol.  For any worsening symptoms, come back to the ED immediately.  Thank you. Dental pain may be caused by many things, including:  Tooth decay (cavities or caries). Cavities cause the nerve of your tooth to be open to air and hot or cold temperatures. This can cause pain or discomfort.  Abscess or infection. A dental abscess is an area that is full of infected pus from a bacterial infection in the inner part of the tooth (pulp). It usually happens at the end of the tooth's root.  Injury.  An unknown reason (idiopathic). Your pain may be mild or severe. It may only happen when:  You are chewing.  You are exposed to hot or cold temperature.  You are eating or drinking sugary foods or beverages, such as:  Soda.  Candy. Your pain may also be there all of the time. HOME CARE Watch your dental pain for any changes. Do these things to lessen your discomfort:  Take medicines only as told by your dentist.  If your dentist tells you to take an antibiotic medicine, finish all of it even if you start to feel better.  Keep all follow-up visits as told by your dentist. This is important.  Do not apply heat to the outside of your face.  Rinse your mouth or gargle with salt water if told by your dentist. This helps with pain and swelling.  You can make salt water by adding  tsp of salt to 1 cup of warm water.  Apply ice to the painful area of your face:  Put ice in a plastic bag.  Place a towel between your skin and the bag.  Leave the ice on for 20 minutes, 2-3 times per day.  Avoid foods or drinks that cause you pain, such as:  Very hot or very cold foods or drinks.  Sweet or sugary foods or drinks. GET HELP IF:  Your pain is not helped with medicines.  Your symptoms  are worse.  You have new symptoms. GET HELP RIGHT AWAY IF:  You cannot open your mouth.  You are having trouble breathing or swallowing.  You have a fever.  Your face, neck, or jaw is puffy (swollen).   This information is not intended to replace advice given to you by your health care provider. Make sure you discuss any questions you have with your health care provider.   Document Released: 09/25/2007 Document Revised: 08/23/2014 Document Reviewed: 04/04/2014 Elsevier Interactive Patient Education [employer]2016 Elsevier Inc.

## 2015-04-14 NOTE — ED Notes (Signed)
The pt is c/o a toothache for 2 weeks

## 2015-04-14 NOTE — ED Provider Notes (Signed)
CSN: [id number]     Arrival date & time 04/14/15  0100 History  By signing my name below, I, Freida Busman, attest that this documentation has been prepared under the direction and in the presence of Tomasita Crumble, MD . Electronically Signed: Freida Busman, Scribe. 04/14/2015. 2:11 AM.    Chief Complaint  Patient presents with  . Dental Problem    The history is provided by the patient. No language interpreter was used.     HPI Comments:  Brian Mendez is a 29 y.o. male who presents to the Emergency Department complaining of 10/10 left sided dental pain for 2 weeks. Pt notes he has a wisdom tooth on that side that is not coming in straight. Patient states he was taking Excedrin at home without any relief. Pt has no other complaints or symptoms at this time.    History reviewed. No pertinent past medical history. Past Surgical History  Procedure Laterality Date  . Appendectomy  2004   No family history on file. Social History  Substance Use Topics  . Smoking status: Current Every Day Smoker  . Smokeless tobacco: Current User  . Alcohol Use: Yes     Comment: socially - once/week    Review of Systems  10 systems reviewed and all are negative for acute change except as noted in the HPI.   Allergies  Review of patient's allergies indicates no known allergies.  Home Medications   Prior to Admission medications   Medication Sig Start Date End Date Taking? Authorizing Provider  benzonatate (TESSALON) 200 MG capsule Take 1 capsule (200 mg total) by mouth 3 (three) times daily as needed for cough. 07/27/14   Marisa Severin, MD  guaiFENesin-codeine 100-10 MG/5ML syrup Take 5 mLs by mouth every 4 (four) hours as needed for cough. 07/27/14   Marisa Severin, MD  [facility] OP Place 1 drop into the left eye daily.    Historical Provider, MD  prednisoLONE acetate (PRED FORTE) 1 % ophthalmic suspension Place 1 drop into the right eye every 2 (two) hours while awake. 04/28/14   Nicole  Pisciotta, PA-C   BP 134/91 mmHg  Pulse 63  Temp(Src) 98 F (36.7 C) (Oral)  Resp 16  SpO2 97% Physical Exam  Constitutional: He is oriented to person, place, and time. Vital signs are normal. He appears well-developed and well-nourished.  Non-toxic appearance. He does not appear ill. No distress.  HENT:  Head: Normocephalic and atraumatic.  Nose: Nose normal.  Mouth/Throat: Oropharynx is clear and moist. No oropharyngeal exudate.  Tooth #38 growing in laterally pushing against tooth #37. Significant decay seen on tooth #27. No swelling, no abscess formation, no tenderness to palpation.  Eyes: Conjunctivae and EOM are normal. Pupils are equal, round, and reactive to light. No scleral icterus.  Neck: Normal range of motion. Neck supple. No tracheal deviation, no edema, no erythema and normal range of motion present. No thyroid mass and no thyromegaly present.  Cardiovascular: Normal rate, regular rhythm, S1 normal, S2 normal, normal heart sounds, intact distal pulses and normal pulses.  Exam reveals no gallop and no friction rub.   No murmur heard. Pulmonary/Chest: Effort normal and breath sounds normal. No respiratory distress. He has no wheezes. He has no rhonchi. He has no rales.  Abdominal: Soft. Normal appearance and bowel sounds are normal. He exhibits no distension, no ascites and no mass. There is no hepatosplenomegaly. There is no tenderness. There is no rebound, no guarding and no CVA tenderness.  Musculoskeletal: Normal  range of motion. He exhibits no edema or tenderness.  Lymphadenopathy:    He has no cervical adenopathy.  Neurological: He is alert and oriented to person, place, and time. He has normal strength. No cranial nerve deficit or sensory deficit.  Skin: Skin is warm, dry and intact. No petechiae and no rash noted. He is not diaphoretic. No erythema. No pallor.  Psychiatric: He has a normal mood and affect. His behavior is normal. Judgment normal.  Nursing note and  vitals reviewed.   ED Course  Procedures   DIAGNOSTIC STUDIES:  Oxygen Saturation is 97% on RA, normal by my interpretation.    COORDINATION OF CARE:  2:11 AM Discussed treatment plan with pt at bedside and pt agreed to plan.  Labs Review Labs Reviewed - No data to display  Imaging Review No results found. I have personally reviewed and evaluated these images and lab results as part of my medical decision-making.    MDM   Final diagnoses:  None   patient presents to the emergency department for dental pain. His pain has been hurting for 2 weeks. On physical exam I do see the wisdom teeth coming in pushing the adjacent teeth the side. This is likely causing his pain. Is no sign of swelling or infection. He was given Norco and ibuprofen emergency department. Will be discharged with 8 tablets of tramadol to take for breakthrough pain. He was given dental follow-up. He appears well in no acute distress, vital signs were within his normal limits and he is safe for discharge.    I personally performed the services described in this documentation, which was scribed in my presence. The recorded information has been reviewed and is accurate.      Tomasita CrumbleAdeleke Sharnice Bosler, MD 04/14/15 [phone]

## 2023-12-22 ENCOUNTER — Ambulatory Visit (HOSPITAL_COMMUNITY)
Admission: EM | Admit: 2023-12-22 | Discharge: 2023-12-22 | Disposition: A | Discharge status: Home / Self Care | Payer: Self-pay | Attending: Emergency Medicine | Admitting: Emergency Medicine

## 2023-12-22 ENCOUNTER — Ambulatory Visit (INDEPENDENT_AMBULATORY_CARE_PROVIDER_SITE_OTHER): Payer: Self-pay

## 2023-12-22 ENCOUNTER — Encounter (HOSPITAL_COMMUNITY): Payer: Self-pay | Admitting: *Deleted

## 2023-12-22 DIAGNOSIS — S60931A Unspecified superficial injury of right thumb, initial encounter: Secondary | ICD-10-CM

## 2023-12-22 DIAGNOSIS — S60111A Contusion of right thumb with damage to nail, initial encounter: Secondary | ICD-10-CM

## 2023-12-22 DIAGNOSIS — S6991XA Unspecified injury of right wrist, hand and finger(s), initial encounter: Secondary | ICD-10-CM

## 2023-12-22 MED ORDER — IBUPROFEN 800 MG PO TABS
ORAL_TABLET | ORAL | Status: AC
  Filled 2023-12-22: qty 1

## 2023-12-22 MED ORDER — IBUPROFEN 800 MG PO TABS
800.0000 mg | ORAL_TABLET | Freq: Once | ORAL | Status: AC
  Administered 2023-12-22: 800 mg via ORAL

## 2023-12-22 MED ORDER — IBUPROFEN 800 MG PO TABS: 800.0000 mg | ORAL_TABLET | Freq: Three times a day (TID) | ORAL | 0 refills | Status: AC
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
